# Patient Record
Sex: Female | Born: 1995 | Race: Black or African American | Hispanic: No | Marital: Single
Health system: Southern US, Community
[De-identification: ages and names within clinical notes are randomized; demographics above are authoritative.]

## PROBLEM LIST (undated history)

## (undated) DIAGNOSIS — D571 Sickle-cell disease without crisis: Secondary | ICD-10-CM

---

## 2018-02-12 ENCOUNTER — Emergency Department (HOSPITAL_COMMUNITY)
Admission: EM | Admit: 2018-02-12 | Discharge: 2018-02-12 | Disposition: A | Discharge status: Home / Self Care | Payer: BC Managed Care – PPO | Attending: Emergency Medicine | Admitting: Emergency Medicine

## 2018-02-12 ENCOUNTER — Encounter (HOSPITAL_COMMUNITY): Payer: Self-pay | Admitting: *Deleted

## 2018-02-12 ENCOUNTER — Emergency Department (HOSPITAL_COMMUNITY): Payer: BC Managed Care – PPO

## 2018-02-12 DIAGNOSIS — R519 Headache, unspecified: Secondary | ICD-10-CM

## 2018-02-12 DIAGNOSIS — Z79899 Other long term (current) drug therapy: Secondary | ICD-10-CM | POA: Insufficient documentation

## 2018-02-12 DIAGNOSIS — D5700 Hb-SS disease with crisis, unspecified: Secondary | ICD-10-CM | POA: Diagnosis not present

## 2018-02-12 DIAGNOSIS — R51 Headache: Secondary | ICD-10-CM | POA: Diagnosis not present

## 2018-02-12 DIAGNOSIS — Y9389 Activity, other specified: Secondary | ICD-10-CM | POA: Diagnosis not present

## 2018-02-12 DIAGNOSIS — Y999 Unspecified external cause status: Secondary | ICD-10-CM | POA: Insufficient documentation

## 2018-02-12 DIAGNOSIS — Y92410 Unspecified street and highway as the place of occurrence of the external cause: Secondary | ICD-10-CM | POA: Insufficient documentation

## 2018-02-12 HISTORY — DX: Sickle-cell disease without crisis: D57.1

## 2018-02-12 LAB — COMPREHENSIVE METABOLIC PANEL
ALBUMIN: 4.4 g/dL (ref 3.5–5.0)
ALK PHOS: 50 U/L (ref 38–126)
ALT: 16 U/L (ref 0–44)
AST: 25 U/L (ref 15–41)
Anion gap: 6 (ref 5–15)
BUN: 5 mg/dL — ABNORMAL LOW (ref 6–20)
CALCIUM: 9 mg/dL (ref 8.9–10.3)
CO2: 25 mmol/L (ref 22–32)
CREATININE: 0.41 mg/dL — AB (ref 0.44–1.00)
Chloride: 108 mmol/L (ref 98–111)
GFR calc non Af Amer: >60 mL/min (ref >60)
GLUCOSE: 103 mg/dL — AB (ref 70–99)
Potassium: 3.7 mmol/L (ref 3.5–5.1)
SODIUM: 139 mmol/L (ref 135–145)
Total Bilirubin: 2.2 mg/dL — ABNORMAL HIGH (ref 0.3–1.2)
Total Protein: 9.2 g/dL — ABNORMAL HIGH (ref 6.5–8.1)

## 2018-02-12 LAB — CBC WITH DIFFERENTIAL/PLATELET
Abs Immature Granulocytes: 0.02 10*3/uL (ref 0.00–0.07)
BASOS ABS: 0 10*3/uL (ref 0.0–0.1)
BASOS PCT: 0 %
Eosinophils Absolute: 0.1 10*3/uL (ref 0.0–0.5)
Eosinophils Relative: 1 %
HCT: 24 % — ABNORMAL LOW (ref 36.0–46.0)
Hemoglobin: 8.4 g/dL — ABNORMAL LOW (ref 12.0–15.0)
Immature Granulocytes: 0 %
Lymphocytes Relative: 42 %
Lymphs Abs: 4.3 10*3/uL — ABNORMAL HIGH (ref 0.7–4.0)
MCH: 39.1 pg — ABNORMAL HIGH (ref 26.0–34.0)
MCHC: 35 g/dL (ref 30.0–36.0)
MCV: 111.6 fL — AB (ref 80.0–100.0)
MONO ABS: 0.9 10*3/uL (ref 0.1–1.0)
Monocytes Relative: 9 %
NEUTROS ABS: 4.8 10*3/uL (ref 1.7–7.7)
NEUTROS PCT: 48 %
PLATELETS: 156 10*3/uL (ref 150–400)
RBC: 2.15 MIL/uL — AB (ref 3.87–5.11)
RDW: 15.3 % (ref 11.5–15.5)
SMEAR REVIEW: UNDETERMINED
WBC: 10.1 10*3/uL (ref 4.0–10.5)
nRBC: 0.2 % (ref 0.0–0.2)

## 2018-02-12 LAB — I-STAT BETA HCG BLOOD, ED (MC, WL, AP ONLY): I-stat hCG, quantitative: <5 m[IU]/mL (ref <5)

## 2018-02-12 LAB — RETICULOCYTES
Immature Retic Fract: 28.2 % — ABNORMAL HIGH (ref 2.3–15.9)
RBC.: 2.15 MIL/uL — ABNORMAL LOW (ref 3.87–5.11)
RETIC CT PCT: 7.2 % — AB (ref 0.4–3.1)
Retic Count, Absolute: 153.9 10*3/uL (ref 19.0–186.0)

## 2018-02-12 MED ORDER — HYDROMORPHONE HCL 2 MG/ML IJ SOLN: 2.0000 mg | INTRAMUSCULAR | Status: AC

## 2018-02-12 MED ORDER — KETOROLAC TROMETHAMINE 30 MG/ML IJ SOLN
30.0000 mg | INTRAMUSCULAR | Status: AC
  Administered 2018-02-12: 30 mg via INTRAVENOUS
  Filled 2018-02-12: qty 1

## 2018-02-12 MED ORDER — HYDROMORPHONE HCL 2 MG/ML IJ SOLN: 2.0000 mg | INTRAMUSCULAR | Status: DC

## 2018-02-12 MED ORDER — ACETAMINOPHEN 500 MG PO TABS
1000.0000 mg | ORAL_TABLET | Freq: Once | ORAL | Status: AC
  Administered 2018-02-12: 1000 mg via ORAL
  Filled 2018-02-12: qty 2

## 2018-02-12 MED ORDER — DIPHENHYDRAMINE HCL 25 MG PO CAPS
25.0000 mg | ORAL_CAPSULE | ORAL | Status: DC | PRN
  Administered 2018-02-12 (×2): 25 mg via ORAL
  Filled 2018-02-12 (×2): qty 1

## 2018-02-12 MED ORDER — HYDROMORPHONE HCL 2 MG/ML IJ SOLN
2.0000 mg | INTRAMUSCULAR | Status: AC
  Administered 2018-02-12: 2 mg via INTRAVENOUS
  Filled 2018-02-12: qty 1

## 2018-02-12 MED ORDER — DEXTROSE-NACL 5-0.45 % IV SOLN
INTRAVENOUS | Status: DC
  Administered 2018-02-12: 19:00:00 via INTRAVENOUS
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
## 2018-02-12 NOTE — Discharge Instructions (Addendum)
Continue taking home medications as prescribed. Use pain medications as needed.  Use ice packs or heating pads if this helps control your pain. You will likely have continued muscle stiffness and soreness over the next couple days.  Follow-up with primary care in 1 week if your symptoms are not improving. Return to the emergency room if you develop vision changes, vomiting, slurred speech, numbness, loss of bowel or bladder control, or any new or worsening symptoms.

## 2018-02-12 NOTE — ED Notes (Signed)
Pt provided 2 sandwiches, ginger ale and sprite per her request
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
